# Patient Record
Sex: Female | Born: 1960 | Race: White | Hispanic: No | Smoking: Current every day smoker
Health system: Southern US, Community
[De-identification: ages and names within clinical notes are randomized; demographics above are authoritative.]

## PROBLEM LIST (undated history)

## (undated) DIAGNOSIS — N390 Urinary tract infection, site not specified: Secondary | ICD-10-CM

## (undated) DIAGNOSIS — J349 Unspecified disorder of nose and nasal sinuses: Secondary | ICD-10-CM

## (undated) DIAGNOSIS — J302 Other seasonal allergic rhinitis: Secondary | ICD-10-CM

## (undated) DIAGNOSIS — J45909 Unspecified asthma, uncomplicated: Secondary | ICD-10-CM

## (undated) HISTORY — PX: OTHER SURGICAL HISTORY: SHX169

## (undated) HISTORY — PX: TONSILLECTOMY: SUR1361

## (undated) HISTORY — PX: APPENDECTOMY: SHX54

---

## 2014-09-12 ENCOUNTER — Emergency Department (INDEPENDENT_AMBULATORY_CARE_PROVIDER_SITE_OTHER)
Admission: EM | Admit: 2014-09-12 | Discharge: 2014-09-12 | Disposition: A | Discharge status: Home / Self Care | Payer: BLUE CROSS/BLUE SHIELD | Source: Home / Self Care | Attending: Family Medicine | Admitting: Family Medicine

## 2014-09-12 ENCOUNTER — Encounter: Payer: Self-pay | Admitting: Emergency Medicine

## 2014-09-12 DIAGNOSIS — J069 Acute upper respiratory infection, unspecified: Secondary | ICD-10-CM | POA: Diagnosis not present

## 2014-09-12 DIAGNOSIS — B9789 Other viral agents as the cause of diseases classified elsewhere: Principal | ICD-10-CM

## 2014-09-12 DIAGNOSIS — J9801 Acute bronchospasm: Secondary | ICD-10-CM | POA: Diagnosis not present

## 2014-09-12 HISTORY — DX: Other seasonal allergic rhinitis: J30.2

## 2014-09-12 HISTORY — DX: Urinary tract infection, site not specified: N39.0

## 2014-09-12 HISTORY — DX: Unspecified disorder of nose and nasal sinuses: J34.9

## 2014-09-12 MED ORDER — AZITHROMYCIN 250 MG PO TABS: ORAL_TABLET | ORAL | Status: DC

## 2014-09-12 MED ORDER — BENZONATATE 200 MG PO CAPS: 200.0000 mg | ORAL_CAPSULE | Freq: Every day | ORAL | Status: DC

## 2014-09-12 MED ORDER — PREDNISONE 20 MG PO TABS: 20.0000 mg | ORAL_TABLET | Freq: Two times a day (BID) | ORAL | Status: DC

## 2014-09-12 NOTE — Discharge Instructions (Signed)
Take plain guaifenesin (1200mg extended release tabs such as Mucinex) twice daily, with plenty of water, for cough and congestion.  May add Pseudoephedrine (30mg, one or two every 4 to 6 hours) for sinus congestion.  Get adequate rest.   °May use Afrin nasal spray (or generic oxymetazoline) twice daily for about 5 days.  Also recommend using saline nasal spray several times daily and saline nasal irrigation (AYR is a common brand).  Use Flonase nasal spray each morning after using Afrin nasal spray and saline nasal irrigation. °Try warm salt water gargles for sore throat.  °Stop all antihistamines for now, and other non-prescription cough/cold preparations. °Begin Azithromycin if not improving about one week or if persistent fever develops   °Follow-up with family doctor if not improving about10 days.  °

## 2014-09-12 NOTE — ED Provider Notes (Signed)
CSN: [id number]     Arrival date & time 09/12/14  1041 History   First MD Initiated Contact with Patient 09/12/14 1059     Chief Complaint  Patient presents with  . Hoarse  . Chest Pain  . Nasal Congestion      HPI Comments: Patient complains of five day history of progressive typical cold-like symptoms including mild sore throat, sinus congestion, headache, fatigue, hoarseness, and cough.  She has had a low grade fever to 99degrees with chills/sweats.  Her sore throat has persisted and yesterday she had occasional wheezing. She has seasonal rhinitis, and a past history of childhood asthma.  She continues to smoke.  The history is provided by the patient.    Past Medical History  Diagnosis Date  . Seasonal allergies   . Frequent UTI   . Sinus disorder    Past Surgical History  Procedure Laterality Date  . Tonsillectomy    . Appendectomy    . Fibroidectomy     Family History  Problem Relation Age of Onset  . Hypertension Mother   . Diabetes Paternal Uncle    History  Substance Use Topics  . Smoking status: Current Every Day Smoker  . Smokeless tobacco: Not on file  . Alcohol Use: No   OB History    No data available     Review of Systems + sore throat + hoarse + cough No pleuritic pain + wheezing + nasal congestion + post-nasal drainage + sinus pain/pressure No itchy/red eyes No earache No hemoptysis No SOB + fever, + chills No nausea No vomiting No abdominal pain No diarrhea No urinary symptoms No skin rash + fatigue + myalgias + headache Used OTC meds without relief  Allergies  Review of patient's allergies indicates no known allergies.  Home Medications   Prior to Admission medications   Medication Sig Start Date End Date Taking? Authorizing Provider  azithromycin (ZITHROMAX Z-PAK) 250 MG tablet Take 2 tabs today; then begin one tab once daily for 4 more days. (Rx void after 09/20/14) 09/12/14   Lattie Haw, MD  benzonatate (TESSALON) 200  MG capsule Take 1 capsule (200 mg total) by mouth at bedtime. Take as needed for cough 09/12/14   Lattie Haw, MD  predniSONE (DELTASONE) 20 MG tablet Take 1 tablet (20 mg total) by mouth 2 (two) times daily. Take with food. 09/12/14   Lattie Haw, MD   BP 103/70 mmHg  Pulse 75  Temp(Src) 98.2 F (36.8 C) (Oral)  Resp 18  Ht  (1.676 m)  Wt 133 lb (60.328 kg)  BMI 21.48 kg/m2  SpO2 97% Physical Exam Nursing notes and Vital Signs reviewed. Appearance:  Patient appears stated age, and in no acute distress Eyes:  Pupils are equal, round, and reactive to light and accomodation.  Extraocular movement is intact.  Conjunctivae are not inflamed  Ears:  Canals normal.  Tympanic membranes normal.  Nose:  Congested turbinates.  No sinus tenderness.  Pharynx:  Normal Neck:  Supple.  Tender enlarged posterior nodes are palpated bilaterally  Lungs:  Faint expiratory wheeze posteriorly.  Breath sounds are equal.  Heart:  Regular rate and rhythm without murmurs, rubs, or gallops.  Abdomen:  Nontender without masses or hepatosplenomegaly.  Bowel sounds are present.  No CVA or flank tenderness.  Extremities:  No edema.  No calf tenderness Skin:  No rash present.   ED Course  Procedures  none   MDM   1. Viral URI with  cough   2. Bronchospasm, acute    There is no evidence of bacterial infection today.   Begin prednisone burst.  Prescription written for Benzonatate (Tessalon) to take at bedtime for night-time cough.  Take plain guaifenesin (1200mg  extended release tabs such as Mucinex) twice daily, with plenty of water, for cough and congestion.  May add Pseudoephedrine (30mg , one or two every 4 to 6 hours) for sinus congestion.  Get adequate rest.   May use Afrin nasal spray (or generic oxymetazoline) twice daily for about 5 days.  Also recommend using saline nasal spray several times daily and saline nasal irrigation (AYR is a common brand).  Use Flonase nasal spray each morning after  using Afrin nasal spray and saline nasal irrigation. Try warm salt water gargles for sore throat.  Stop all antihistamines for now, and other non-prescription cough/cold preparations. Begin Azithromycin if not improving about one week or if persistent fever develops (Given a prescription to hold, with an expiration date)  Follow-up with family doctor if not improving about10 days.    Lattie HawStephen A Beese, MD 09/12/14 [phone]

## 2014-09-12 NOTE — ED Notes (Signed)
Reports onset of congestion 5 days ago; thought it was seasonal allergies; now aches all over including upper chest with cough; soreness of throat, only low grade fever. No OTCs today.

## 2014-09-14 ENCOUNTER — Telehealth: Payer: Self-pay | Admitting: Emergency Medicine

## 2014-09-14 NOTE — ED Notes (Signed)
Inquired about patient's status; encourage them to call with questions/concerns.  

## 2016-02-24 ENCOUNTER — Encounter: Payer: Self-pay | Admitting: *Deleted

## 2016-02-24 ENCOUNTER — Emergency Department
Admission: EM | Admit: 2016-02-24 | Discharge: 2016-02-24 | Disposition: A | Discharge status: Home / Self Care | Payer: 59 | Source: Home / Self Care | Attending: Family Medicine | Admitting: Family Medicine

## 2016-02-24 DIAGNOSIS — J209 Acute bronchitis, unspecified: Secondary | ICD-10-CM

## 2016-02-24 HISTORY — DX: Unspecified asthma, uncomplicated: J45.909

## 2016-02-24 MED ORDER — AZITHROMYCIN 250 MG PO TABS: 250.0000 mg | ORAL_TABLET | Freq: Every day | ORAL | 0 refills | Status: AC

## 2016-02-24 MED ORDER — PREDNISONE 20 MG PO TABS: ORAL_TABLET | ORAL | 0 refills | Status: DC

## 2016-02-24 MED ORDER — BENZONATATE 100 MG PO CAPS: 100.0000 mg | ORAL_CAPSULE | Freq: Three times a day (TID) | ORAL | 0 refills | Status: AC

## 2016-02-24 MED ORDER — ALBUTEROL SULFATE HFA 108 (90 BASE) MCG/ACT IN AERS: 1.0000 | INHALATION_SPRAY | Freq: Four times a day (QID) | RESPIRATORY_TRACT | 0 refills | Status: AC | PRN

## 2016-02-24 NOTE — ED Triage Notes (Signed)
Pt c/o non productive cough, low grade temp 99.3, and fatigue x 1 wk. She has taken Mucinex for 2 days.

## 2016-02-24 NOTE — ED Provider Notes (Signed)
CSN: [id number]     Arrival date & time 02/24/16  1128 History   First MD Initiated Contact with Patient 02/24/16 1149     Chief Complaint  Patient presents with  . Cough   (Consider location/radiation/quality/duration/timing/severity/associated sxs/prior Treatment) HPI  Lisa HellingDarlene Rogers is a 55 y.o. female presenting to UC with c/o persistent non-productive cough for about 1 week, associated low-grade fever of 99.3*F and fatigue.  She did take Muxinex the last 2 days but only minimal relief.  She has had to miss last 2 days of work due to fatigue and cough.  She has used an inhaler in the past and prednisone when she gets sick.  Denies chest pain or SOB. Hx of asthma as a child. Denies n/v/d.  No known sick contacts, however, pt does work in the hospital.   Past Medical History:  Diagnosis Date  . Asthma    as a child  . Frequent UTI   . Seasonal allergies   . Sinus disorder    Past Surgical History:  Procedure Laterality Date  . APPENDECTOMY    . fibroidectomy    . TONSILLECTOMY     Family History  Problem Relation Age of Onset  . Hypertension Mother   . Diabetes Paternal Uncle    Social History  Substance Use Topics  . Smoking status: Current Every Day Smoker  . Smokeless tobacco: Never Used  . Alcohol use No   OB History    No data available     Review of Systems  Constitutional: Positive for fever ( low-grade). Negative for chills.  HENT: Positive for congestion and rhinorrhea. Negative for ear pain, sore throat, trouble swallowing and voice change.   Respiratory: Positive for cough and chest tightness. Negative for shortness of breath.   Cardiovascular: Negative for chest pain and palpitations.  Gastrointestinal: Negative for abdominal pain, diarrhea, nausea and vomiting.  Musculoskeletal: Negative for arthralgias, back pain and myalgias.  Skin: Negative for rash.    Allergies  Review of patient's allergies indicates no known allergies.  Home Medications    Prior to Admission medications   Medication Sig Start Date End Date Taking? Authorizing Provider  albuterol (PROVENTIL HFA;VENTOLIN HFA) 108 (90 Base) MCG/ACT inhaler Inhale 1-2 puffs into the lungs every 6 (six) hours as needed for wheezing or shortness of breath. 02/24/16   Junius FinnerErin O'Malley, PA-C  azithromycin (ZITHROMAX) 250 MG tablet Take 1 tablet (250 mg total) by mouth daily. Take first 2 tablets together, then 1 every day until finished. 02/24/16   Junius FinnerErin O'Malley, PA-C  benzonatate (TESSALON) 100 MG capsule Take 1-2 capsules (100-200 mg total) by mouth every 8 (eight) hours. 02/24/16   Junius FinnerErin O'Malley, PA-C  predniSONE (DELTASONE) 20 MG tablet 3 tabs po day one, then 2 po daily x 4 days 02/24/16   Junius FinnerErin O'Malley, PA-C   Meds Ordered and Administered this Visit  Medications - No data to display  BP 108/73 (BP Location: Left Arm)   Pulse 60   Temp 98 F (36.7 C) (Oral)   Resp 16   Ht 5\' 6"  (1.676 m)   Wt 142 lb (64.4 kg)   SpO2 98%   BMI 22.92 kg/m  No data found.   Physical Exam  Constitutional: She appears well-developed and well-nourished. No distress.  HENT:  Head: Normocephalic and atraumatic.  Right Ear: Tympanic membrane normal.  Left Ear: Tympanic membrane normal.  Nose: Nose normal.  Mouth/Throat: Uvula is midline, oropharynx is clear and moist and mucous membranes are  normal.  Eyes: Conjunctivae are normal. No scleral icterus.  Neck: Normal range of motion. Neck supple.  Cardiovascular: Normal rate, regular rhythm and normal heart sounds.   Pulmonary/Chest: Effort normal. No stridor. No respiratory distress. She has no wheezes. She has rhonchi in the left lower field. She has no rales.  Abdominal: Soft. She exhibits no distension. There is no tenderness.  Musculoskeletal: Normal range of motion.  Lymphadenopathy:    She has no cervical adenopathy.  Neurological: She is alert.  Skin: Skin is warm and dry. She is not diaphoretic.  Nursing note and vitals  reviewed.   Urgent Care Course   Clinical Course    Procedures (including critical care time)  Labs Review Labs Reviewed - No data to display  Imaging Review No results found.   MDM   1. Acute bronchitis, unspecified organism    Pt c/o worsening cough for about 1 week, associated generalized weakness and fatigue.   Rhonchi noted in Left lower lung field.  Will treat for acute bronchitis. Rx: azithromycin, prednisone, tessalon, and albuterol inhaler. Encouraged f/u with PCP in 1 week if not improving, sooner if worsening. Patient verbalized understanding and agreement with treatment plan.     Junius Finner, PA-C 02/24/16 1336

## 2016-05-19 DIAGNOSIS — R05 Cough: Secondary | ICD-10-CM | POA: Diagnosis not present

## 2016-05-19 DIAGNOSIS — J111 Influenza due to unidentified influenza virus with other respiratory manifestations: Secondary | ICD-10-CM | POA: Diagnosis not present

## 2016-05-19 DIAGNOSIS — Z87440 Personal history of urinary (tract) infections: Secondary | ICD-10-CM | POA: Diagnosis not present

## 2016-05-19 DIAGNOSIS — Z87891 Personal history of nicotine dependence: Secondary | ICD-10-CM | POA: Diagnosis not present

## 2016-05-19 DIAGNOSIS — R0789 Other chest pain: Secondary | ICD-10-CM | POA: Diagnosis not present

## 2016-05-20 DIAGNOSIS — R9431 Abnormal electrocardiogram [ECG] [EKG]: Secondary | ICD-10-CM | POA: Diagnosis not present

## 2016-05-20 DIAGNOSIS — R079 Chest pain, unspecified: Secondary | ICD-10-CM | POA: Diagnosis not present

## 2016-05-21 DIAGNOSIS — M5020 Other cervical disc displacement, unspecified cervical region: Secondary | ICD-10-CM | POA: Diagnosis not present

## 2016-05-21 DIAGNOSIS — R1110 Vomiting, unspecified: Secondary | ICD-10-CM | POA: Diagnosis not present

## 2016-05-21 DIAGNOSIS — Z87440 Personal history of urinary (tract) infections: Secondary | ICD-10-CM | POA: Diagnosis not present

## 2016-05-21 DIAGNOSIS — R197 Diarrhea, unspecified: Secondary | ICD-10-CM | POA: Diagnosis not present

## 2016-05-21 DIAGNOSIS — R05 Cough: Secondary | ICD-10-CM | POA: Diagnosis not present

## 2016-05-21 DIAGNOSIS — Z87891 Personal history of nicotine dependence: Secondary | ICD-10-CM | POA: Diagnosis not present

## 2016-05-21 DIAGNOSIS — M5126 Other intervertebral disc displacement, lumbar region: Secondary | ICD-10-CM | POA: Diagnosis not present

## 2016-05-22 DIAGNOSIS — R9431 Abnormal electrocardiogram [ECG] [EKG]: Secondary | ICD-10-CM | POA: Diagnosis not present

## 2016-05-22 DIAGNOSIS — I444 Left anterior fascicular block: Secondary | ICD-10-CM | POA: Diagnosis not present

## 2016-11-19 ENCOUNTER — Emergency Department (HOSPITAL_COMMUNITY): Payer: 59

## 2016-11-19 ENCOUNTER — Emergency Department (HOSPITAL_COMMUNITY)
Admission: EM | Admit: 2016-11-19 | Discharge: 2016-11-19 | Disposition: A | Discharge status: Home / Self Care | Payer: 59 | Attending: Emergency Medicine | Admitting: Emergency Medicine

## 2016-11-19 ENCOUNTER — Encounter (HOSPITAL_COMMUNITY): Payer: Self-pay | Admitting: Emergency Medicine

## 2016-11-19 DIAGNOSIS — F17200 Nicotine dependence, unspecified, uncomplicated: Secondary | ICD-10-CM | POA: Insufficient documentation

## 2016-11-19 DIAGNOSIS — R000 Tachycardia, unspecified: Secondary | ICD-10-CM | POA: Diagnosis not present

## 2016-11-19 DIAGNOSIS — J45909 Unspecified asthma, uncomplicated: Secondary | ICD-10-CM | POA: Diagnosis not present

## 2016-11-19 DIAGNOSIS — F419 Anxiety disorder, unspecified: Secondary | ICD-10-CM | POA: Insufficient documentation

## 2016-11-19 DIAGNOSIS — R079 Chest pain, unspecified: Secondary | ICD-10-CM | POA: Diagnosis not present

## 2016-11-19 DIAGNOSIS — Z79899 Other long term (current) drug therapy: Secondary | ICD-10-CM | POA: Insufficient documentation

## 2016-11-19 LAB — BASIC METABOLIC PANEL
Anion gap: 16 — ABNORMAL HIGH (ref 5–15)
BUN: 20 mg/dL (ref 6–20)
CALCIUM: 9.7 mg/dL (ref 8.9–10.3)
CO2: 21 mmol/L — ABNORMAL LOW (ref 22–32)
Chloride: 98 mmol/L — ABNORMAL LOW (ref 101–111)
Creatinine, Ser: 1.11 mg/dL — ABNORMAL HIGH (ref 0.44–1.00)
GFR calc non Af Amer: 54 mL/min — ABNORMAL LOW (ref >60)
Glucose, Bld: 113 mg/dL — ABNORMAL HIGH (ref 65–99)
Potassium: 2.7 mmol/L — CL (ref 3.5–5.1)
SODIUM: 135 mmol/L (ref 135–145)

## 2016-11-19 LAB — I-STAT TROPONIN, ED: TROPONIN I, POC: 0 ng/mL (ref 0.00–0.08)

## 2016-11-19 LAB — CBC
HCT: 44.7 % (ref 36.0–46.0)
Hemoglobin: 14.6 g/dL (ref 12.0–15.0)
MCH: 29.1 pg (ref 26.0–34.0)
MCHC: 32.7 g/dL (ref 30.0–36.0)
MCV: 89 fL (ref 78.0–100.0)
Platelets: 435 10*3/uL — ABNORMAL HIGH (ref 150–400)
RBC: 5.02 MIL/uL (ref 3.87–5.11)
RDW: 12.9 % (ref 11.5–15.5)
WBC: 14 10*3/uL — ABNORMAL HIGH (ref 4.0–10.5)

## 2016-11-19 MED ORDER — PROMETHAZINE HCL 25 MG PO TABS: 25.0000 mg | ORAL_TABLET | Freq: Three times a day (TID) | ORAL | 0 refills | Status: AC | PRN

## 2016-11-19 MED ORDER — ONDANSETRON HCL 4 MG/2ML IJ SOLN
4.0000 mg | Freq: Once | INTRAMUSCULAR | Status: AC
Start: 2016-11-19 — End: 2016-11-19
  Administered 2016-11-19: 4 mg via INTRAVENOUS
  Filled 2016-11-19: qty 2

## 2016-11-19 MED ORDER — PROMETHAZINE HCL 25 MG/ML IJ SOLN
12.5000 mg | Freq: Once | INTRAMUSCULAR | Status: AC
  Administered 2016-11-19: 12.5 mg via INTRAVENOUS
  Filled 2016-11-19: qty 1

## 2016-11-19 MED ORDER — LORAZEPAM 2 MG/ML IJ SOLN
2.0000 mg | Freq: Once | INTRAMUSCULAR | Status: AC
Start: 2016-11-19 — End: 2016-11-19
  Administered 2016-11-19: 2 mg via INTRAVENOUS
  Filled 2016-11-19: qty 1

## 2016-11-19 MED ORDER — POTASSIUM CHLORIDE CRYS ER 20 MEQ PO TBCR
40.0000 meq | EXTENDED_RELEASE_TABLET | Freq: Once | ORAL | Status: AC
Start: 2016-11-19 — End: 2016-11-19
  Administered 2016-11-19: 40 meq via ORAL
  Filled 2016-11-19: qty 2

## 2016-11-19 NOTE — ED Notes (Addendum)
Date and time results received: 11/19/16 2028   Test: BMP Critical Value: Potassium 2.7  Name of Provider Notified: Dr.Haviland and Dr.Colson

## 2016-11-19 NOTE — ED Provider Notes (Signed)
[facility]-EMERGENCY DEPT Provider Note   CSN: [id number] Arrival date & time: 11/19/16  1919     History   Chief Complaint Chief Complaint  Patient presents with  . Chest Pain    HPI Lisa Holland is a 56 y.o. female.  The history is provided by the patient.  Illness  This is a new problem. The current episode started 1 to 2 hours ago. The problem occurs constantly. The problem has been gradually worsening. Associated symptoms include chest pain and shortness of breath. Pertinent negatives include no abdominal pain and no headaches. Nothing aggravates the symptoms. Relieved by: ativan, reassurance.     Past Medical History:  Diagnosis Date  . Asthma    as a child  . Frequent UTI   . Seasonal allergies   . Sinus disorder     There are no active problems to display for this patient.   Past Surgical History:  Procedure Laterality Date  . APPENDECTOMY    . fibroidectomy    . TONSILLECTOMY      OB History    No data available       Home Medications    Prior to Admission medications   Medication Sig Start Date End Date Taking? Authorizing Provider  UNKNOWN TO PATIENT Diet capsule packets: Take 2 capsules by mouth (from each of the three packets) once a day   Yes [provider]  albuterol (PROVENTIL HFA;VENTOLIN HFA) 108 (90 Base) MCG/ACT inhaler Inhale 1-2 puffs into the lungs every 6 (six) hours as needed for wheezing or shortness of breath. Patient not taking: Reported on 11/19/2016 02/24/16   Lurene Shadow, PA-C  azithromycin (ZITHROMAX) 250 MG tablet Take 1 tablet (250 mg total) by mouth daily. Take first 2 tablets together, then 1 every day until finished. Patient not taking: Reported on 11/19/2016 02/24/16   Lurene Shadow, PA-C  benzonatate (TESSALON) 100 MG capsule Take 1-2 capsules (100-200 mg total) by mouth every 8 (eight) hours. Patient not taking: Reported on 11/19/2016 02/24/16   Lurene Shadow, PA-C  predniSONE (DELTASONE) 20 MG tablet 3 tabs po day  one, then 2 po daily x 4 days Patient not taking: Reported on 11/19/2016 02/24/16   Lurene Shadow, PA-C    Family History Family History  Problem Relation Age of Onset  . Hypertension Mother   . Diabetes Paternal Uncle     Social History Social History  Substance Use Topics  . Smoking status: Current Every Day Smoker  . Smokeless tobacco: Never Used  . Alcohol use No     Allergies   Patient has no known allergies.   Review of Systems Review of Systems  Constitutional: Positive for diaphoresis. Negative for chills and fever.  HENT: Negative for ear pain and sore throat.   Eyes: Negative for pain and visual disturbance.  Respiratory: Positive for shortness of breath. Negative for cough.   Cardiovascular: Positive for chest pain. Negative for palpitations.  Gastrointestinal: Negative for abdominal pain and vomiting.  Genitourinary: Negative for dysuria and hematuria.  Musculoskeletal: Negative for arthralgias and back pain.  Skin: Negative for color change and rash.  Allergic/Immunologic: Negative for immunocompromised state.  Neurological: Positive for light-headedness. Negative for seizures, syncope and headaches.  Psychiatric/Behavioral: Negative for agitation and behavioral problems. The patient is nervous/anxious.   All other systems reviewed and are negative.    Physical Exam Updated Vital Signs BP 132/83   Pulse 93   Resp 16   SpO2 100%   Physical  Exam  Constitutional: She is oriented to person, place, and time. She appears well-developed and well-nourished. She appears distressed.  HENT:  Head: Normocephalic and atraumatic.  Eyes: Conjunctivae and EOM are normal. Pupils are equal, round, and reactive to light.  Neck: Normal range of motion. Neck supple. No tracheal deviation present.  Cardiovascular: Regular rhythm and intact distal pulses.   No murmur heard. Initially tachycardic and after Ativan patient with normal heart rate.  Pulmonary/Chest: Effort  normal and breath sounds normal. No respiratory distress.  Abdominal: Soft. She exhibits no distension. There is no tenderness.  Musculoskeletal: She exhibits no edema.  Neurological: She is alert and oriented to person, place, and time. No cranial nerve deficit.  Skin: Skin is warm and dry. She is not diaphoretic.  Psychiatric: She has a normal mood and affect.  Patient very anxious  Nursing note and vitals reviewed.    ED Treatments / Results  Labs (all labs ordered are listed, but only abnormal results are displayed) Labs Reviewed  BASIC METABOLIC PANEL - Abnormal; Notable for the following:       Result Value   Potassium 2.7 (*)    Chloride 98 (*)    CO2 21 (*)    Glucose, Bld 113 (*)    Creatinine, Ser 1.11 (*)    GFR calc non Af Amer 54 (*)    Anion gap 16 (*)    All other components within normal limits  CBC - Abnormal; Notable for the following:    WBC 14.0 (*)    Platelets 435 (*)    All other components within normal limits  I-STAT TROPOININ, ED    EKG  EKG Interpretation None       Radiology Dg Chest 2 View  Result Date: 11/19/2016 CLINICAL DATA:  Left-sided chest pain. EXAM: CHEST  2 VIEW COMPARISON:  None. FINDINGS: The heart size and mediastinal contours are within normal limits. Both lungs are clear. The visualized skeletal structures are unremarkable. IMPRESSION: No active cardiopulmonary disease. Electronically Signed   By: Gerome Sam III M.D   On: 11/19/2016 20:19    Procedures Procedures (including critical care time)  Medications Ordered in ED Medications  potassium chloride SA (K-DUR,KLOR-CON) CR tablet 40 mEq (0 mEq Oral Hold 11/19/16 2051)  ondansetron (ZOFRAN) injection 4 mg (4 mg Intravenous Given 11/19/16 1946)  LORazepam (ATIVAN) injection 2 mg (2 mg Intravenous Given 11/19/16 1949)  ondansetron (ZOFRAN) injection 4 mg (4 mg Intravenous Given 11/19/16 2122)     Initial Impression / Assessment and Plan / ED Course  I have reviewed the  triage vital signs and the nursing notes.  Pertinent labs & imaging results that were available during my care of the patient were reviewed by me and considered in my medical decision making (see chart for details).     Lisa Holland is presenting with chest pain that acutely started about an hour ago. States it's left sided under her armpit and radiating up to her neck as well as somewhat down her arm. Endorses arm tingling on the left side. Also shortness of breath, nausea, shakiness. Has never had anything like this happen before. No cardiac history. No diaphoresis, no recent travel or immobilization, no recent surgery.Patient does endorse taking some diet pills earlier today for the first time. She received them from her friend and is not sure what they were or what was in them.  On exam she is in moderate distress and appears anxious. Tachycardic and Her blood pressure is  within normal limits and she is satting 98% on room air on my exam. Lungs are clear bilaterally. No abdominal tenderness. Patient does appear anxious so she was given 2 mg Ativan. EKG shows sinus tachycardia but is otherwise reassuring as is other lab work. Doubt ACS, pulmonary embolism.   Upon reassessment at 8:30 PM, patient was more calm and now mildly tachycardic into the 100s. States she feels much better. Also receiving 1 L normal saline bolus. Potassium was decreased so she was given oral potassium here in the emergency department. Upon recheck around 9:50 PM, patient stated she felt much better vital signs are stable within normal limits at time of recheck. Through decision making, patient stated she felt comfortable going home. Instructed not to take any more diet pills, as this presentation was likely consistent with her taking 6 diet pills. She voiced understanding and agreement to the plan and was comfort with outpatient follow-up. Patient having mild nausea so she was discharged with prescription for Phenergan and given  1 dose before discharge. Tolerating by mouth at time of discharge.  Patient was seen with my attending, Dr. Particia NearingHaviland, who voiced agreement and oversaw the evaluation and treatment of this patient.   Dr[employer] illustratorvoice dictation software was used in the creation of this note. If there are any errors or inconsistencies needing clarification, please contact me directly.   Final Clinical Impressions(s) / ED Diagnoses   Final diagnoses:  Anxiety    New Prescriptions New Prescriptions   No medications on file     Orson Slickolson, Tyronza Happe, MD 11/19/16 2241    Jacalyn LefevreHaviland, Julie, MD 11/19/16 2246

## 2016-11-19 NOTE — Discharge Instructions (Signed)
Do not take any more diet pills.

## 2016-11-19 NOTE — ED Notes (Signed)
Provider at bedside

## 2016-11-19 NOTE — ED Notes (Signed)
Pt c/o left sided chest pain radiating into L shoulder and between shoulder blades. Hx of cervical discs problems and panic attacks in the past. Pain is different and worse than previous episodes. Also reports taking an unknown diet pill earlier today. Pt initially diaphoretic and nauseous on arrival.

## 2016-11-19 NOTE — ED Triage Notes (Signed)
Patient reports sudden onset left chest pain radiating to mid back with SOB and nausea .

## 2016-11-19 NOTE — ED Notes (Signed)
Pt reports one episode of vomiting after zofran was given

## 2018-06-30 DIAGNOSIS — M2391 Unspecified internal derangement of right knee: Secondary | ICD-10-CM | POA: Diagnosis not present

## 2018-06-30 DIAGNOSIS — S8391XA Sprain of unspecified site of right knee, initial encounter: Secondary | ICD-10-CM | POA: Diagnosis not present

## 2018-06-30 DIAGNOSIS — F17200 Nicotine dependence, unspecified, uncomplicated: Secondary | ICD-10-CM | POA: Diagnosis not present

## 2018-07-04 DIAGNOSIS — M25561 Pain in right knee: Secondary | ICD-10-CM | POA: Diagnosis not present

## 2018-07-04 DIAGNOSIS — Z6821 Body mass index (BMI) 21.0-21.9, adult: Secondary | ICD-10-CM | POA: Diagnosis not present

## 2018-07-04 DIAGNOSIS — S82121A Displaced fracture of lateral condyle of right tibia, initial encounter for closed fracture: Secondary | ICD-10-CM | POA: Diagnosis not present

## 2018-07-04 DIAGNOSIS — S82124A Nondisplaced fracture of lateral condyle of right tibia, initial encounter for closed fracture: Secondary | ICD-10-CM | POA: Diagnosis not present

## 2018-07-18 DIAGNOSIS — S82121A Displaced fracture of lateral condyle of right tibia, initial encounter for closed fracture: Secondary | ICD-10-CM | POA: Diagnosis not present

## 2018-07-18 DIAGNOSIS — Z6821 Body mass index (BMI) 21.0-21.9, adult: Secondary | ICD-10-CM | POA: Diagnosis not present

## 2018-07-31 IMAGING — DX DG CHEST 2V
2 series · 2 of 2 positions shown · non-contrast
Comparison: None.

CLINICAL DATA: Left-sided chest pain.

EXAM:
CHEST  2 VIEW

[chest lat]
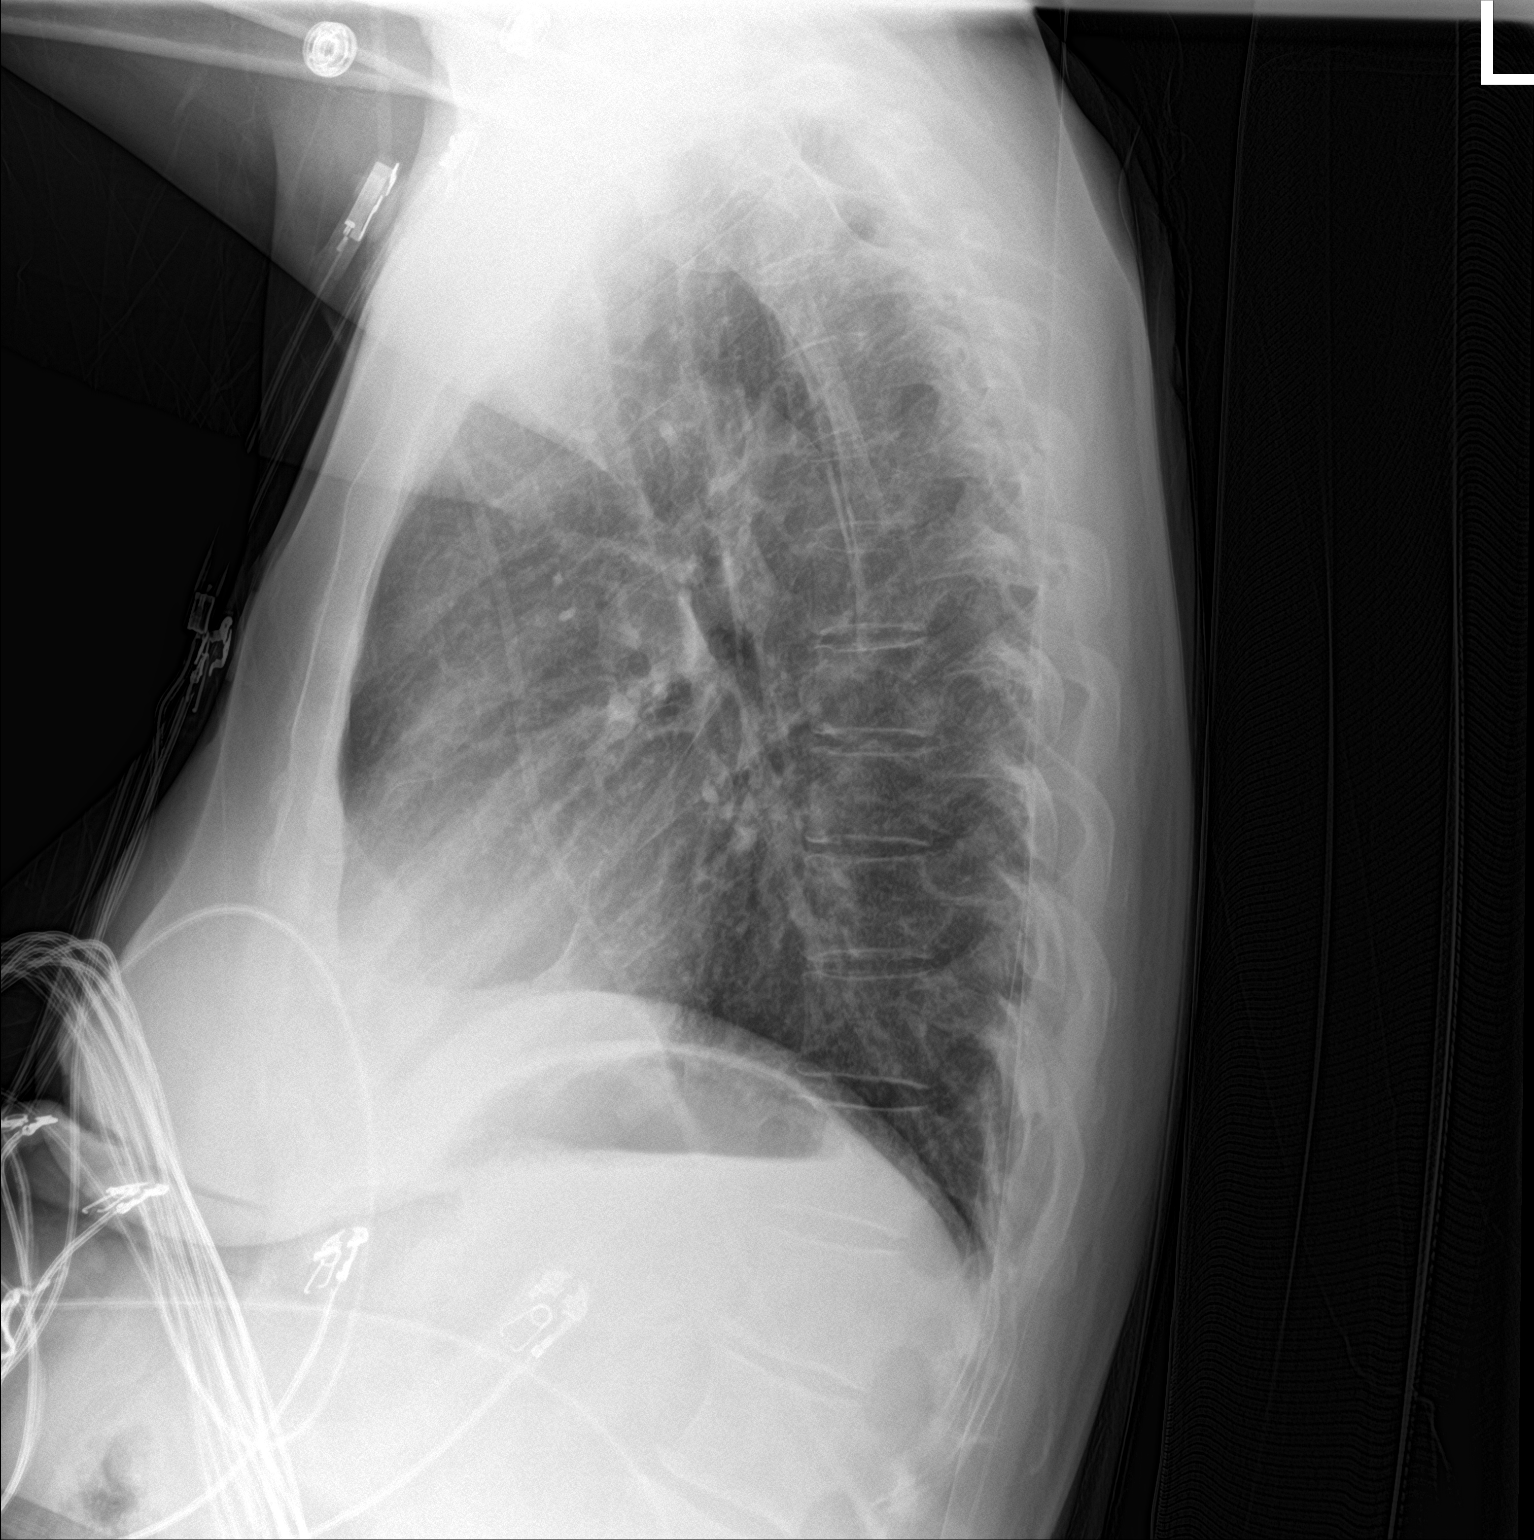

[chest ap]
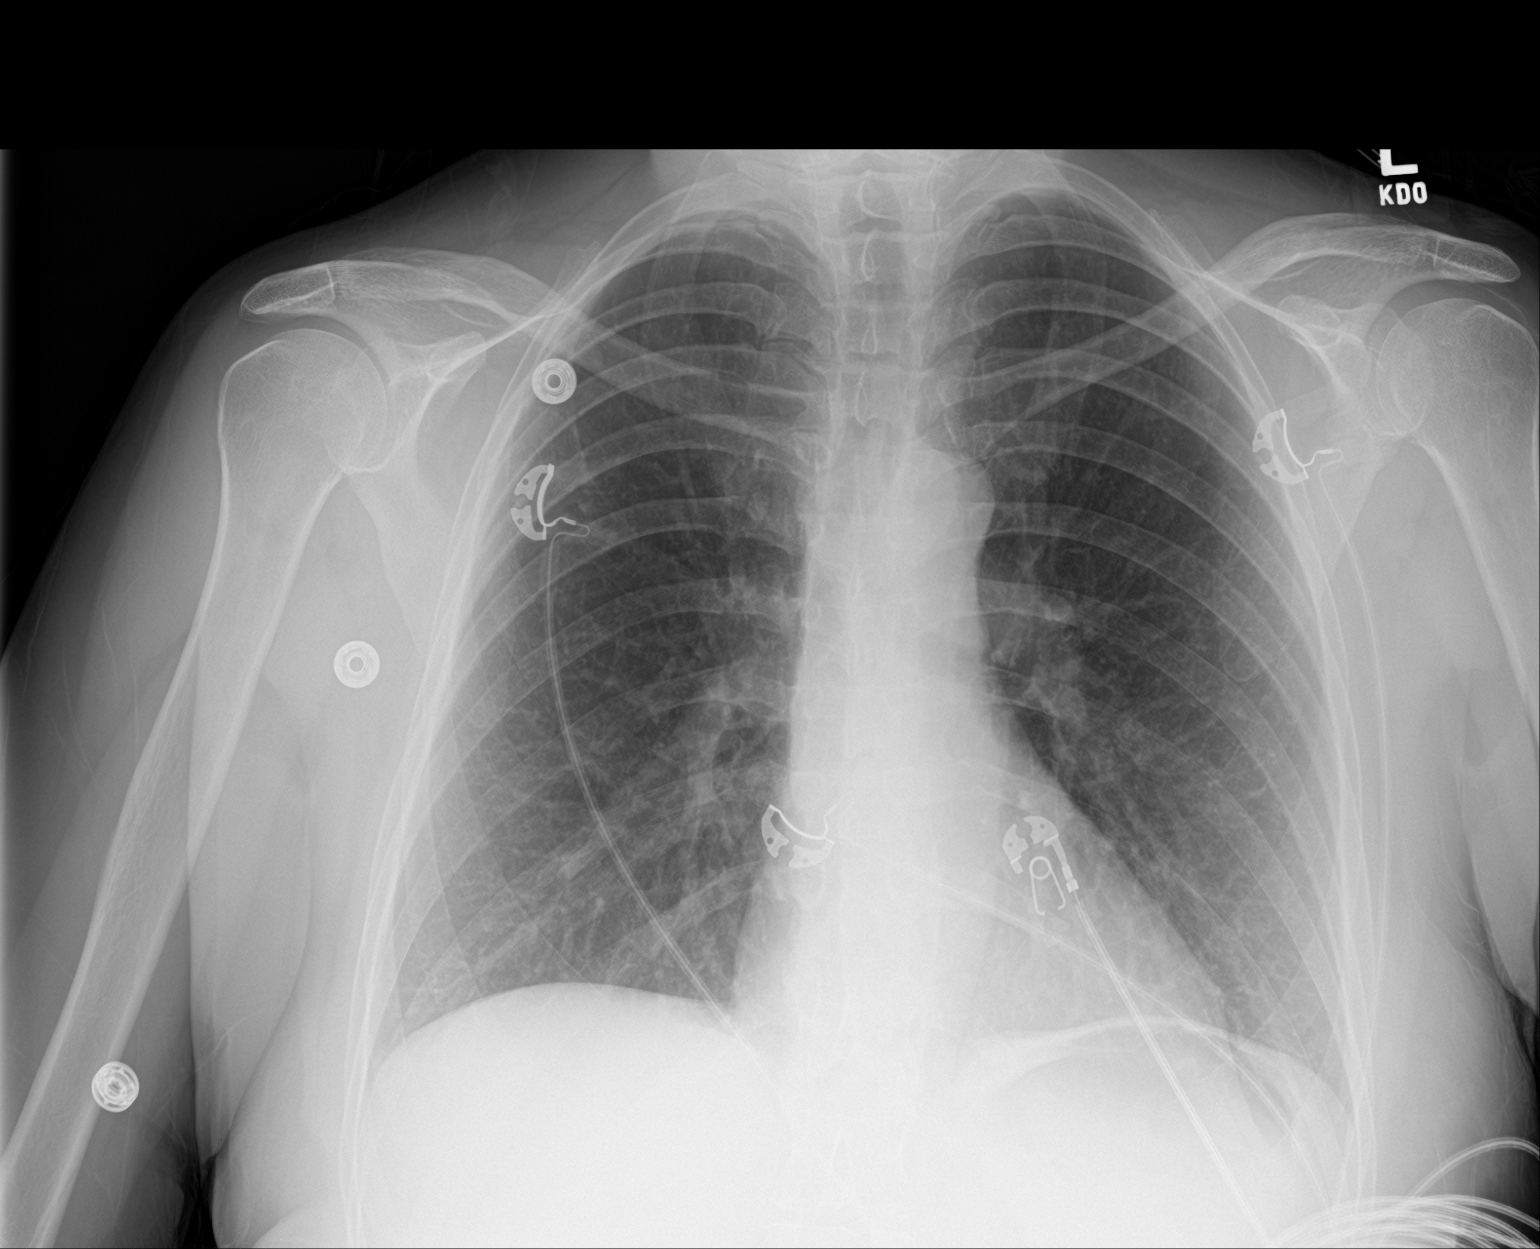

[2 of 2 positions shown; findings below may reference images not displayed]

FINDINGS: The heart size and mediastinal contours are within normal limits.
Both lungs are clear. The visualized skeletal structures are
unremarkable.
IMPRESSION: No active cardiopulmonary disease.

## 2018-08-24 DIAGNOSIS — Z9889 Other specified postprocedural states: Secondary | ICD-10-CM | POA: Diagnosis not present

## 2018-08-24 DIAGNOSIS — S82121D Displaced fracture of lateral condyle of right tibia, subsequent encounter for closed fracture with routine healing: Secondary | ICD-10-CM | POA: Diagnosis not present

## 2018-10-19 DIAGNOSIS — M25561 Pain in right knee: Secondary | ICD-10-CM | POA: Diagnosis not present

## 2018-11-12 DIAGNOSIS — M25561 Pain in right knee: Secondary | ICD-10-CM | POA: Diagnosis not present

## 2019-02-02 ENCOUNTER — Emergency Department (INDEPENDENT_AMBULATORY_CARE_PROVIDER_SITE_OTHER)
Admission: EM | Admit: 2019-02-02 | Discharge: 2019-02-02 | Disposition: A | Discharge status: Home / Self Care | Payer: 59 | Source: Home / Self Care

## 2019-02-02 ENCOUNTER — Emergency Department (INDEPENDENT_AMBULATORY_CARE_PROVIDER_SITE_OTHER): Payer: 59

## 2019-02-02 ENCOUNTER — Encounter: Payer: Self-pay | Admitting: Emergency Medicine

## 2019-02-02 ENCOUNTER — Other Ambulatory Visit: Payer: Self-pay

## 2019-02-02 DIAGNOSIS — M79672 Pain in left foot: Secondary | ICD-10-CM | POA: Diagnosis not present

## 2019-02-02 DIAGNOSIS — M25572 Pain in left ankle and joints of left foot: Secondary | ICD-10-CM

## 2019-02-02 MED ORDER — MELOXICAM 7.5 MG PO TABS: 7.5000 mg | ORAL_TABLET | Freq: Every day | ORAL | 0 refills | Status: AC

## 2019-02-02 MED ORDER — PREDNISONE 20 MG PO TABS: 40.0000 mg | ORAL_TABLET | Freq: Every day | ORAL | 0 refills | Status: AC

## 2019-02-02 NOTE — ED Triage Notes (Signed)
Pt states she has worked multiple long shift at work and since Friday her left heel has been hurting constantly. Tried ice, ibuprofen and rest with no relief.

## 2019-02-02 NOTE — Discharge Instructions (Signed)
Meloxicam (Mobic) is an antiinflammatory to help with pain and inflammation.  Do not take ibuprofen, Advil, Aleve, or any other medications that contain NSAIDs while taking meloxicam as this may cause stomach upset or even ulcers if taken in large amounts for an extended period of time.  Use caution when taking with the prednisone to help prevent stomach upset and ulcers. You should take with a full glass of water and can take with food.  Please follow up with Sports Medicine or your orthopedist later this week if not improving.

## 2019-02-02 NOTE — ED Provider Notes (Addendum)
[facility]    CSN: [id number] Arrival date & time: 02/02/19  1523      History   Chief Complaint Chief Complaint  Patient presents with  . Foot Pain    HPI Lisa Holland is a 58 y.o. female.   HPI Lisa Holland is a 58 y.o. female presenting to UC with c/o Left heel pain that started yesterday morning after working 2 extra shifts at work this week.  Pain is aching and sharp, worse with weightbearing and ambulation. She has tried rest, ice, and ibuprofen w/o relief.  Denies known injury. Denies hx of heel problems in the past. Pt is requesting a work note for tomorrow.   Past Medical History:  Diagnosis Date  . Asthma    as a child  . Frequent UTI   . Seasonal allergies   . Sinus disorder     There are no active problems to display for this patient.   Past Surgical History:  Procedure Laterality Date  . APPENDECTOMY    . fibroidectomy    . TONSILLECTOMY      OB History   No obstetric history on file.      Home Medications    Prior to Admission medications   Medication Sig Start Date End Date Taking? Authorizing Provider  albuterol (PROVENTIL HFA;VENTOLIN HFA) 108 (90 Base) MCG/ACT inhaler Inhale 1-2 puffs into the lungs every 6 (six) hours as needed for wheezing or shortness of breath. Patient not taking: Reported on 11/19/2016 02/24/16   Lurene ShadowPhelps, Lucciano Vitali O, PA-C  azithromycin (ZITHROMAX) 250 MG tablet Take 1 tablet (250 mg total) by mouth daily. Take first 2 tablets together, then 1 every day until finished. Patient not taking: Reported on 11/19/2016 02/24/16   Lurene ShadowPhelps, Jalaila Caradonna O, PA-C  benzonatate (TESSALON) 100 MG capsule Take 1-2 capsules (100-200 mg total) by mouth every 8 (eight) hours. Patient not taking: Reported on 11/19/2016 02/24/16   Lurene ShadowPhelps, Serria Sloma O, PA-C  meloxicam (MOBIC) 7.5 MG tablet Take 1-2 tablets (7.5-15 mg total) by mouth daily. For up to 5 days, then as needed for pain 02/02/19   Lurene ShadowPhelps, Janaya Broy O, PA-C  predniSONE (DELTASONE) 20 MG tablet  Take 2 tablets (40 mg total) by mouth daily with breakfast for 4 days. 3 tabs po day one, then 2 po daily x 4 days 02/02/19 02/06/19  Lurene ShadowPhelps, Sahira Cataldi O, PA-C  promethazine (PHENERGAN) 25 MG tablet Take 1 tablet (25 mg total) by mouth every 8 (eight) hours as needed for nausea or vomiting. 11/19/16   Orson Slickolson, Andrew, MD  UNKNOWN TO PATIENT Diet capsule packets: Take 2 capsules by mouth (from each of the three packets) once a day    [provider]    Family History Family History  Problem Relation Age of Onset  . Hypertension Mother   . Diabetes Paternal Uncle     Social History Social History   Tobacco Use  . Smoking status: Current Every Day Smoker  . Smokeless tobacco: Never Used  Substance Use Topics  . Alcohol use: No  . Drug use: No     Allergies   Tramadol   Review of Systems Review of Systems  Musculoskeletal: Positive for arthralgias and myalgias. Negative for joint swelling.  Neurological: Negative for weakness and numbness.     Physical Exam Triage Vital Signs ED Triage Vitals [02/02/19 1542]  Enc Vitals Group     BP 104/72     Pulse Rate (!) 52     Resp 17  Temp 98.5 F (36.9 C)     Temp Source Oral     SpO2 98 %     Weight 146 lb (66.2 kg)     Height 5\' 6"  (1.676 m)     Head Circumference      Peak Flow      Pain Score      Pain Loc      Pain Edu?      Excl. in [place]?    No data found.  Updated Vital Signs BP 104/72 (BP Location: Right Arm)   Pulse (!) 52   Temp 98.5 F (36.9 C) (Oral)   Resp 17   Ht 5\' 6"  (1.676 m)   Wt 146 lb (66.2 kg)   SpO2 98%   BMI 23.57 kg/m   Visual Acuity Right Eye Distance:   Left Eye Distance:   Bilateral Distance:    Right Eye Near:   Left Eye Near:    Bilateral Near:     Physical Exam Vitals signs and nursing note reviewed.  Constitutional:      Appearance: Normal appearance. She is well-developed.  HENT:     Head: Normocephalic and atraumatic.  Neck:     Musculoskeletal: Normal range of  motion.  Cardiovascular:     Rate and Rhythm: Regular rhythm. Bradycardia present.     Pulses:          Dorsalis pedis pulses are 2+ on the left side.       Posterior tibial pulses are 2+ on the left side.     Comments: Mild bradycardia, regular rhythm Pulmonary:     Effort: Pulmonary effort is normal.  Musculoskeletal: Normal range of motion.        General: Tenderness present. No swelling.     Comments: Left ankle and foot: no edema or deformity. Mild tenderness to posterior ankle and bottom of heel. Full ROM ankle and toes.  No calf or foot tenderness.   Skin:    General: Skin is warm and dry.  Neurological:     Mental Status: She is alert and oriented to person, place, and time.  Psychiatric:        Behavior: Behavior normal.      UC Treatments / Results  Labs (all labs ordered are listed, but only abnormal results are displayed) Labs Reviewed - No data to display  EKG   Radiology Dg Foot Complete Left  Result Date: 02/02/2019 CLINICAL DATA:  Left heel pain, working 12 hour shifts, no known injury EXAM: LEFT FOOT - COMPLETE 3+ VIEW COMPARISON:  None. FINDINGS: No fracture or dislocation of the left foot. Joint spaces are well preserved. Incidental, small benign bone island of the distal left fourth metatarsal. Soft tissues are unremarkable. IMPRESSION: No fracture or dislocation of the left foot. Joint spaces are well preserved. Electronically Signed   By: Eddie Candle M.D.   On: 02/02/2019 15:58    Procedures Procedures (including critical care time)  Medications Ordered in UC Medications - No data to display  Initial Impression / Assessment and Plan / UC Course  I have reviewed the triage vital signs and the nursing notes.  Pertinent labs & imaging results that were available during my care of the patient were reviewed by me and considered in my medical decision making (see chart for details).     Reviewed imaging with pt Pain appears to be due to overuse  injury. Will have pt try trial of meloxicam and prednisone.  She notes  she has seen an orthopedist in the past for knee injections. Encouraged to f/u with previously established orthopedist or sports medicine later this week if needed. AVS provided.  Final Clinical Impressions(s) / UC Diagnoses   Final diagnoses:  Pain of left heel  Acute left ankle pain     Discharge Instructions     Meloxicam (Mobic) is an antiinflammatory to help with pain and inflammation.  Do not take ibuprofen, Advil, Aleve, or any other medications that contain NSAIDs while taking meloxicam as this may cause stomach upset or even ulcers if taken in large amounts for an extended period of time.  Use caution when taking with the prednisone to help prevent stomach upset and ulcers. You should take with a full glass of water and can take with food.  Please follow up with Sports Medicine or your orthopedist later this week if not improving.     ED Prescriptions    Medication Sig Dispense Auth. Provider   predniSONE (DELTASONE) 20 MG tablet Take 2 tablets (40 mg total) by mouth daily with breakfast for 4 days. 3 tabs po day one, then 2 po daily x 4 days 8 tablet Sri Clegg O, PA-C   meloxicam (MOBIC) 7.5 MG tablet Take 1-2 tablets (7.5-15 mg total) by mouth daily. For up to 5 days, then as needed for pain 20 tablet Lurene Shadow, PA-C     PDMP not reviewed this encounter.   Lurene Shadow, PA-C 02/03/19 0907    Lurene Shadow, PA-C 02/03/19 [phone]
